# Patient Record
Sex: Male | Born: 1995 | Race: Black or African American | Hispanic: No | Marital: Single | Smoking: Never smoker
Health system: Southern US, Community
[De-identification: ages and names within clinical notes are randomized; demographics above are authoritative.]

## PROBLEM LIST (undated history)

## (undated) DIAGNOSIS — T7840XA Allergy, unspecified, initial encounter: Secondary | ICD-10-CM

## (undated) HISTORY — PX: OTHER SURGICAL HISTORY: SHX169

## (undated) HISTORY — DX: Allergy, unspecified, initial encounter: T78.40XA

---
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
## 2017-05-14 ENCOUNTER — Ambulatory Visit: Payer: Self-pay | Admitting: Family Medicine

## 2017-05-23 ENCOUNTER — Other Ambulatory Visit: Payer: Self-pay

## 2017-05-23 ENCOUNTER — Encounter: Payer: Self-pay | Admitting: Family Medicine

## 2017-05-23 ENCOUNTER — Ambulatory Visit (INDEPENDENT_AMBULATORY_CARE_PROVIDER_SITE_OTHER): Payer: Self-pay | Admitting: Family Medicine

## 2017-05-23 VITALS — BP 120/62 | HR 60 | Temp 98.4°F | Resp 14 | Ht 68.0 in | Wt 194.0 lb

## 2017-05-23 DIAGNOSIS — K2970 Gastritis, unspecified, without bleeding: Secondary | ICD-10-CM

## 2017-05-23 DIAGNOSIS — J302 Other seasonal allergic rhinitis: Secondary | ICD-10-CM

## 2017-05-23 DIAGNOSIS — K5900 Constipation, unspecified: Secondary | ICD-10-CM

## 2017-05-23 DIAGNOSIS — F4321 Adjustment disorder with depressed mood: Secondary | ICD-10-CM

## 2017-05-23 NOTE — Patient Instructions (Signed)
F/U Physical in the fall Get zyrtec or claritin over the counter Try the miralax 1 packet in water or apple juice dexilant for 2 weeks for gastritis

## 2017-05-23 NOTE — Progress Notes (Signed)
Subjective:    Patient ID: David Marquez, male    DOB: 10/24/1995, 22 y.o.   MRN: [id number]  Patient presents for Leg Pain (L leg pain during basic training- would like recheck); Allergies (increased seasonal allergies); and Gastric Ulcer (reports that he had ulcer in basic training, but has not had any issues since he came home)   While in the [employer] took his first PT test- had sharp pains up and down lower left leg , would have pain on and off with activites/drills  Seen by medics, given "pain killers"  Ibuprofen put out of lower body PT for about 6 weeks or so   Able to run now    Was on ibuprofen for a few months, started having stomach pains/ told Still constipated, no blood in the stool   still gets some discomfort in stomach with eating  No difficulty urinating    Allergies- severe alergies, sneezing/runny nose/itching, congestion phlegm  since Feb Out of flonase Also took mucinex D    Admits to feeling depressed has been so for > 6 months 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 States stress from all over, family, school, day to day. Cant pinpoint one thing. He is not in a relationship states "he doesn't have time". Likes to keep to himself, does not talk to his parents or family about how he is feeing most of the time    Review Of Systems:  GEN- denies fatigue, fever, weight loss,weakness, recent illness HEENT- denies eye drainage, change in vision, +nasal discharge, CVS- denies chest pain, palpitations RESP- denies SOB, cough, wheeze ABD- denies N/V, change in stools, +abd pain GU- denies dysuria, hematuria, dribbling, incontinence MSK- denies joint pain, muscle aches, injury Neuro- denies headache, dizziness, syncope, seizure activity       Objective:    BP 120/62   Pulse 60   Temp 98.4 F (36.9 C) (Oral)   Resp 14   Ht  (1.727 m)   Wt 194 lb (88 kg)   SpO2 97%   BMI 29.50 kg/m  GEN- NAD, alert and oriented x3 HEENT- PERRL, EOMI, non  injected sclera, pink conjunctiva, MMM, oropharynx clear, nares clear rhinorrhea, TM clear bilat no effusion Neck- Supple, no LAD CVS- RRR, no murmur RESP-CTAB ABD-NABS,soft,NT,ND Psych- normal affect and mood MSK- FROM upper , lower ext, strength equal bilat EXT- No edema Pulses- Radial  2+        Assessment & Plan:     Possible previous tear in calf muscle with the PT, now resolved Problem List Items Addressed This Visit      Unprioritized   Seasonal allergies    otc flonase, oral antihistamine       Other Visit Diagnoses    Gastritis without bleeding, unspecified chronicity, unspecified gastritis type    -  Primary   Based on history had definite gastritis, still gives some discomfort, given samples of dexilant, insurance is not active   Constipation, unspecified constipation type       samples of miralax, increase fiber and water    Situational depression       discussed therapy. 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 See if they have therapist or if he can proceed with civilian therapist F/U via phone in 1 week, no SI      Note: This dictation was prepared with Dragon dictation along with smaller phrase technology. Any transcriptional errors that result from this  process are unintentional.

## 2017-05-23 NOTE — Assessment & Plan Note (Signed)
otc flonase, oral antihistamine

## 2017-05-30 ENCOUNTER — Telehealth: Payer: Self-pay | Admitting: *Deleted

## 2017-05-30 NOTE — Telephone Encounter (Signed)
Call placed to patient. LMTRC.  

## 2017-05-30 NOTE — Telephone Encounter (Signed)
-----   Message from Salley Scarlet, MD sent at 05/30/2017  2:29 PM EDT ----- Regarding: FW: F/U therapy Call pt see, if he looked into therapy with military or if he wants [place] to set it up. Also is insurance active   ----- Message ----- From: Salley Scarlet, MD Sent: 05/30/2017 To: Salley Scarlet, MD Subject: F/U therapy

## 2017-06-01 NOTE — Telephone Encounter (Signed)
Call placed to patient. LMTRC.  

## 2017-06-07 NOTE — Telephone Encounter (Signed)
Multiple calls placed to patient with no answer and no return call.   Message to be closed.  

## 2017-09-10 ENCOUNTER — Encounter: Payer: Self-pay | Admitting: Physician Assistant

## 2017-09-11 ENCOUNTER — Encounter: Payer: Self-pay | Admitting: Family Medicine

## 2017-10-03 ENCOUNTER — Encounter: Payer: Medicaid Other | Admitting: Physician Assistant

## 2017-10-08 ENCOUNTER — Ambulatory Visit (INDEPENDENT_AMBULATORY_CARE_PROVIDER_SITE_OTHER): Admitting: Physician Assistant

## 2017-10-08 ENCOUNTER — Encounter: Payer: Self-pay | Admitting: Physician Assistant

## 2017-10-08 VITALS — BP 138/80 | HR 63 | Temp 97.6°F | Resp 16 | Ht 69.0 in | Wt 204.2 lb

## 2017-10-08 DIAGNOSIS — J029 Acute pharyngitis, unspecified: Secondary | ICD-10-CM

## 2017-10-08 DIAGNOSIS — M7662 Achilles tendinitis, left leg: Secondary | ICD-10-CM | POA: Diagnosis not present

## 2017-10-08 MED ORDER — AMOXICILLIN 875 MG PO TABS
875.0000 mg | ORAL_TABLET | Freq: Two times a day (BID) | ORAL | 0 refills | Status: DC
Start: 2017-10-08 — End: 2018-08-14

## 2017-10-08 NOTE — Progress Notes (Signed)
Patient ID: David Marquez MRN: [id number], DOB: 15-Nov-1995, 22 y.o. Date of Encounter: @DATE @  Chief Complaint:  Chief Complaint  Patient presents with  . Sore Throat    symptoms for 1 week   . jaw tightness    left jaw discomfort when chewing   . pain in back of left leg  . painful when having a bowel movement    HPI: 22 y.o. year old male  presents with above.   He reports that he has had a sore throat for almost 2 weeks. States that the beginning he had some phlegm and drainage there but now it is all just sore throat.  Points to area of lymph nodes and says that he has felt some discomfort in those areas when he is chewing.   He also reports that he is in the [employer] and is supposed to do a PT test in October.  Therefore has been doing some running.  Points to the back of his left heel at the left Achilles region and states that he has been feeling pain there when he is doing his running.  Also states that over the past month he has noticed that sometimes when he has a bowel movement he feels some rectal pain.  States that his stool was hard and he was having to strain and that is when he first started noticing that discomfort there.  States that he does not feel any discomfort at any other time only when he is having BM. States he feels no itching or burning or hemorrhoids.  No other concerns to address today.   Past Medical History:  Diagnosis Date  . Allergy    seasonal     Home Meds: Outpatient Medications Prior to Visit  Medication Sig Dispense Refill  . fluticasone (FLONASE) 50 MCG/ACT nasal spray Place 2 sprays into both nostrils daily. 16 g 6   No facility-administered medications prior to visit.     Allergies: No Known Allergies  Social History   Socioeconomic History  . Marital status: Single    Spouse name: Not on file  . Number of children: Not on file  . Years of education: Not on file  . Highest education level: Not on file  Occupational  History  . Not on file  Social Needs  . Financial resource strain: Not on file  . Food insecurity:    Worry: Not on file    Inability: Not on file  . Transportation needs:    Medical: Not on file    Non-medical: Not on file  Tobacco Use  . Smoking status: Never Smoker  . Smokeless tobacco: Never Used  Substance and Sexual Activity  . Alcohol use: No  . Drug use: No  . Sexual activity: Not on file  Lifestyle  . Physical activity:    Days per week: Not on file    Minutes per session: Not on file  . Stress: Not on file  Relationships  . Social connections:    Talks on phone: Not on file    Gets together: Not on file    Attends religious service: Not on file    Active member of club or organization: Not on file    Attends meetings of clubs or organizations: Not on file    Relationship status: Not on file  . Intimate partner violence:    Fear of current or ex partner: Not on file    Emotionally abused: Not on file  Physically abused: Not on file    Forced sexual activity: Not on file  Other Topics Concern  . Not on file  Social History Narrative  . Not on file    Family History  Problem Relation Age of Onset  . Healthy Mother   . Healthy Father   . Diabetes Father        borderline  . Myopathy Father        unknown cause  . Asthma Sister   . Healthy Brother   . Hypertension Maternal Grandmother   . Healthy Maternal Grandfather   . Diabetes Paternal Grandmother   . Hypertension Paternal Grandmother   . Healthy Paternal Grandfather      Review of Systems:  See HPI for pertinent ROS. All other ROS negative.    Physical Exam: Blood pressure 138/80, pulse 63, temperature 97.6 F (36.4 C), temperature source Oral, resp. rate 16, height 5\' 9"  (1.753 m), weight 92.6 kg, SpO2 97 %., Body mass index is 30.16 kg/m. General: WNWD AAM. Appears in no acute distress. Head: Normocephalic, atraumatic, eyes without discharge, sclera non-icteric, nares are without  discharge. Bilateral auditory canals clear, TM's are without perforation, pearly grey and translucent with reflective cone of light bilaterally.  Posterior pharynx is with moderate erythema.  There is a small ulcerative region on her right side of his pharynx. Neck: Supple. No thyromegaly.  He does have enlarged bilateral tonsillar and cervical lymph nodes.  These are enlarged and tender with palpation. Lungs: Clear bilaterally to auscultation without wheezes, rales, or rhonchi. Breathing is unlabored. Heart: RRR with S1 S2. No murmurs, rubs, or gallops. Abdomen: Soft, non-tender, non-distended with normoactive bowel sounds. No hepatomegaly. No rebound/guarding. No obvious abdominal masses. Musculoskeletal:  Strength and tone normal for age.  Has tenderness with palpation at the posterior aspect of his left heel.  Also had him do a stretch that stretches the Achilles tendon and this does reproduce his symptoms that he has been having. Extremities/Skin: Warm and dry.  Neuro: Alert and oriented X 3. Moves all extremities spontaneously. Gait is normal. CNII-XII grossly in tact. Psych:  Responds to questions appropriately with a normal affect.     ASSESSMENT AND PLAN:  22 y.o. year old male with  1. Acute pharyngitis, unspecified etiology Rapid strep test is negative.  However he has had sore throat for almost 2 weeks and his exam does show significant findings.  He is to take the amoxicillin as directed.  Follow-up if symptoms do not resolve upon completion of antibiotic.  He can use lozenges or spray or Tylenol as needed for symptom relief in the interim. - amoxicillin (AMOXIL) 875 MG tablet; Take 1 tablet (875 mg total) by mouth 2 (two) times daily.  Dispense: 20 tablet; Refill: 0  2. Achilles tendinitis of left lower extremity He is to rest the area.  Stop the running until symptoms resolve.  Is to do this stretch routinely and then once he does start back to running is to do this stretch that I  have demonstrated.  To apply ice to the area.  Given.  He states that if he has a documentation that the PT test can be put off to the following month.  That note has been given.  3. Sore throat - STREP GROUP A AG, W/REFLEX TO CULT  Constipation:  He is to use Miralax.  Start with just a half of a capful daily and then adjust dose.  Follow-up if the symptoms do not resolve.  Murray Hodgkins [place], [place], [facility] 10/08/2017 4:07 PM

## 2017-10-10 LAB — CULTURE, GROUP A STREP
MICRO NUMBER:: 91139489
SOURCE:: 0
SPECIMEN QUALITY: ADEQUATE

## 2017-10-10 LAB — STREP GROUP A AG, W/REFLEX TO CULT: Streptococcus, Group A Screen (Direct): NOT DETECTED

## 2018-02-25 ENCOUNTER — Other Ambulatory Visit: Payer: Self-pay | Admitting: Family Medicine

## 2018-02-25 MED ORDER — OSELTAMIVIR PHOSPHATE 75 MG PO CAPS: 75.0000 mg | ORAL_CAPSULE | Freq: Every day | ORAL | 0 refills | Status: DC

## 2018-02-25 NOTE — Progress Notes (Signed)
Pt father in office  with Flu symptoms  given tamiflu, he has not been vaccinated Mother in office

## 2018-07-01 ENCOUNTER — Emergency Department (HOSPITAL_COMMUNITY)
Admission: EM | Admit: 2018-07-01 | Discharge: 2018-07-01 | Disposition: A | Discharge status: Home / Self Care | Payer: Self-pay | Attending: Emergency Medicine | Admitting: Emergency Medicine

## 2018-07-01 ENCOUNTER — Encounter (HOSPITAL_COMMUNITY): Payer: Self-pay | Admitting: Student

## 2018-07-01 DIAGNOSIS — J020 Streptococcal pharyngitis: Secondary | ICD-10-CM | POA: Insufficient documentation

## 2018-07-01 DIAGNOSIS — Z79899 Other long term (current) drug therapy: Secondary | ICD-10-CM | POA: Insufficient documentation

## 2018-07-01 LAB — GROUP A STREP BY PCR: Group A Strep by PCR: DETECTED — AB

## 2018-07-01 MED ORDER — ONDANSETRON 4 MG PO TBDP: 4.0000 mg | ORAL_TABLET | Freq: Three times a day (TID) | ORAL | 0 refills | Status: DC | PRN

## 2018-07-01 MED ORDER — ACETAMINOPHEN 325 MG PO TABS
650.0000 mg | ORAL_TABLET | Freq: Once | ORAL | Status: AC
Start: 2018-07-01 — End: 2018-07-01
  Administered 2018-07-01: 650 mg via ORAL
  Filled 2018-07-01: qty 2

## 2018-07-01 MED ORDER — ONDANSETRON 4 MG PO TBDP
4.0000 mg | ORAL_TABLET | Freq: Once | ORAL | Status: AC
  Administered 2018-07-01: 4 mg via ORAL
  Filled 2018-07-01: qty 1

## 2018-07-01 MED ORDER — IBUPROFEN 100 MG/5ML PO SUSP
ORAL | Status: AC
  Administered 2018-07-01: 11:00:00 600 mg
  Filled 2018-07-01: qty 30

## 2018-07-01 MED ORDER — DEXAMETHASONE SODIUM PHOSPHATE 10 MG/ML IJ SOLN
10.0000 mg | Freq: Once | INTRAMUSCULAR | Status: AC
  Administered 2018-07-01: 10:00:00 10 mg via INTRAMUSCULAR
  Filled 2018-07-01: qty 1

## 2018-07-01 MED ORDER — NAPROXEN 500 MG PO TABS: 500.0000 mg | ORAL_TABLET | Freq: Two times a day (BID) | ORAL | 0 refills | Status: DC

## 2018-07-01 MED ORDER — LIDOCAINE VISCOUS HCL 2 % MT SOLN
15.0000 mL | Freq: Once | OROMUCOSAL | Status: AC
  Administered 2018-07-01: 10:00:00 15 mL via OROMUCOSAL
  Filled 2018-07-01: qty 15

## 2018-07-01 MED ORDER — PENICILLIN G BENZATHINE 1200000 UNIT/2ML IM SUSP
1.2000 10*6.[IU] | Freq: Once | INTRAMUSCULAR | Status: AC
  Administered 2018-07-01: 11:00:00 1.2 10*6.[IU] via INTRAMUSCULAR
  Filled 2018-07-01: qty 2

## 2018-07-01 NOTE — ED Triage Notes (Signed)
Per mother, pt in with sore throat, hoarseness and fever x 3 days. States temp was 102 PTA. C/o pain when swallowing and feels tonsils are swollen. HA and n/v since yesterday as well

## 2018-07-01 NOTE — ED Provider Notes (Signed)
[facility] Provider Note   CSN: [id number] Arrival date & time: 07/01/18  0849     History   Chief Complaint Chief Complaint  Patient presents with  . Fever  . Sore Throat  . Emesis    HPI David DredgeJeremy R Gupta is a 23 y.o. male who presents to the emergency department with a complaint of sore throat for the past 2 to 3 days.  Patient states that his sore throat has been constant, progressively worsening, and is currently a 10 out of 10 in severity.  Throat pain is associated with sensation of tonsillar swelling, hoarse/scratchy voice, fever with temp max of 102, nausea, one episode of emesis, a few episodes of nonbloody diarrhea, and generalized body aches.  He states his discomfort is worse with attempts to talk and to swallow, however he is able to swallow.  He has tried taking Tylenol without much relief.  Has not had any antiemetics today.  Denies congestion, ear pain, cough, dyspnea, abdominal pain, chest pain, melena, hematochezia, or hematemesis.  He does have sick contacts with a 21-year-old that is sick with similar symptoms and is currently being evaluated in the pediatric emergency department.  He has no known exposures to COVID-19 that he is aware of.     HPI  Past Medical History:  Diagnosis Date  . Allergy    seasonal    Patient Active Problem List   Diagnosis Date Noted  . Allergic rhinitis 05/20/2013  . Heartburn 07/11/2012  . Overweight 07/11/2012  . Seasonal allergies 07/11/2012    Past Surgical History:  Procedure Laterality Date  . none          Home Medications    Prior to Admission medications   Medication Sig Start Date End Date Taking? Authorizing Provider  amoxicillin (AMOXIL) 875 MG tablet Take 1 tablet (875 mg total) by mouth 2 (two) times daily. 10/08/17   Allayne Butcherixon, Mary B, PA-C  fluticasone (FLONASE) 50 MCG/ACT nasal spray Place 2 sprays into both nostrils daily. 10/14/14   Salley Scarleturham, Kawanta F, MD  oseltamivir  (TAMIFLU) 75 MG capsule Take 1 capsule (75 mg total) by mouth daily. 02/25/18   Salley Scarleturham, Kawanta F, MD    Family History Family History  Problem Relation Age of Onset  . Healthy Mother   . Healthy Father   . Diabetes Father        borderline  . Myopathy Father        unknown cause  . Asthma Sister   . Healthy Brother   . Hypertension Maternal Grandmother   . Healthy Maternal Grandfather   . Diabetes Paternal Grandmother   . Hypertension Paternal Grandmother   . Healthy Paternal Grandfather     Social History Social History   Tobacco Use  . Smoking status: Never Smoker  . Smokeless tobacco: Never Used  Substance Use Topics  . Alcohol use: No  . Drug use: No     Allergies   Patient has no known allergies.   Review of Systems Review of Systems  Constitutional: Positive for chills and fever.  HENT: Positive for sore throat, trouble swallowing and voice change. Negative for congestion and ear pain.   Respiratory: Negative for cough and shortness of breath.   Cardiovascular: Negative for chest pain.  Gastrointestinal: Positive for diarrhea, nausea and vomiting (x1). Negative for abdominal pain.  Genitourinary: Negative for dysuria.  Musculoskeletal: Positive for myalgias.  Neurological: Negative for syncope.  All other systems reviewed and are  negative.    Physical Exam Updated Vital Signs BP (!) 140/99 (BP Location: Right Arm)   Pulse 91   Temp (!) 101.1 F (38.4 C) (Oral)   Resp 16   SpO2 99%   Physical Exam Vitals signs and nursing note reviewed.  Constitutional:      General: He is not in acute distress.    Appearance: He is well-developed. He is not toxic-appearing.  HENT:     Head: Normocephalic and atraumatic.     Right Ear: Ear canal normal. Tympanic membrane is not perforated, erythematous or retracted.     Left Ear: Ear canal normal. Tympanic membrane is not perforated, erythematous or retracted.     Ears:     Comments: No mastoid erythema,  tenderness, or swelling.    Nose: Congestion present.     Right Sinus: No maxillary sinus tenderness or frontal sinus tenderness.     Left Sinus: No maxillary sinus tenderness or frontal sinus tenderness.     Mouth/Throat:     Mouth: Mucous membranes are moist.     Pharynx: Uvula midline. Posterior oropharyngeal erythema present.     Tonsils: Tonsillar exudate present. 2+ on the right. 2+ on the left.     Comments: Posterior oropharynx is symmetric appearing. Patient tolerating own secretions without difficulty. No trismus. No drooling. No hot potato voice. Voice is somewhat raspy. No swelling beneath the tongue, submandibular compartment is soft.   Eyes:     General:        Right eye: No discharge.        Left eye: No discharge.     Conjunctiva/sclera: Conjunctivae normal.  Neck:     Musculoskeletal: Normal range of motion and neck supple. No neck rigidity.  Cardiovascular:     Rate and Rhythm: Normal rate and regular rhythm.  Pulmonary:     Effort: Pulmonary effort is normal. No respiratory distress.     Breath sounds: Normal breath sounds. No wheezing, rhonchi or rales.  Abdominal:     General: There is no distension.     Palpations: Abdomen is soft.     Tenderness: There is no abdominal tenderness. There is no guarding or rebound.  Lymphadenopathy:     Cervical: Cervical adenopathy (mild anterior) present.  Skin:    General: Skin is warm and dry.     Findings: No rash.  Neurological:     General: No focal deficit present.     Mental Status: He is alert.     Comments: Clear speech.   Psychiatric:        Behavior: Behavior normal.    ED Treatments / Results  Labs (all labs ordered are listed, but only abnormal results are displayed) Labs Reviewed  GROUP A STREP BY PCR - Abnormal; Notable for the following components:      Result Value   Group A Strep by PCR DETECTED (*)    All other components within normal limits    EKG    Radiology No results found.   Procedures Procedures (including critical care time)  Medications Ordered in ED Medications  ondansetron (ZOFRAN-ODT) disintegrating tablet 4 mg (4 mg Oral Given 07/01/18 0938)  acetaminophen (TYLENOL) tablet 650 mg (650 mg Oral Given 07/01/18 0938)  lidocaine (XYLOCAINE) 2 % viscous mouth solution 15 mL (15 mLs Mouth/Throat Given 07/01/18 0938)  dexamethasone (DECADRON) injection 10 mg (10 mg Intramuscular Given 07/01/18 [id number])     Initial Impression / Assessment and Plan / ED Course  I have  reviewed the triage vital signs and the nursing notes.  Pertinent labs & imaging results that were available during my care of the patient were reviewed by me and considered in my medical decision making (see chart for details).   Patient is a 23 year old male who presents to the emergency department with chief complaint of sore throat as well as associated fever, body aches, 1 episode of emesis, & diarrhea.  He is nontoxic-appearing, in no apparent distress, his vitals are notable for a fever and somewhat elevated blood pressure, doubt HTN emergency.  He does not appear to be septic.  He does have posterior oropharyngeal/tonsillar erythema with exudates & 2+ symmetric tonsillar edema. Exam does not appear consistent w/ RPA/PTA. Tolerating own secretions, no drooling, no hot potato voice. Lungs CTA. Abdomen nontender without peritoneal signs- do not suspect acute surgical abdominal process. Trial of viscous lidocaine, zofran, & Decadron in the ED for symptomatic control, tylenol for fever. Strep swab. Re-assess.   Strep positive- likely etiology to his sxs- IM Bicillin administered in the ED for this.  Feeling somewhat better following interventions here, tolerating PO  Will discharge home with zofran & naproxen. PCP follow up. . I discussed results, treatment plan, need for follow-up, and return precautions with the patient. Provided opportunity for questions, patient confirmed understanding and is in  agreement with plan.   Vitals:   07/01/18 0859 07/01/18 1121  BP: (!) 140/99 125/84  Pulse: 91 80  Resp: 16   Temp: (!) 101.1 F (38.4 C) 100.3 F (37.9 C)  SpO2: 99% 100%    Final Clinical Impressions(s) / ED Diagnoses   Final diagnoses:  Strep pharyngitis    ED Discharge Orders         Ordered    ondansetron (ZOFRAN ODT) 4 MG disintegrating tablet  Every 8 hours PRN     07/01/18 1059    naproxen (NAPROSYN) 500 MG tablet  2 times daily     07/01/18 [address], [place] R, PA-C 07/01/18 [place], [place], DO 07/01/18 1514

## 2018-07-01 NOTE — Discharge Instructions (Addendum)
You were seen in the emergency department and diagnosed with strep throat.  You were given a shot of Penicillin and a shot of Decadron.  - Penicillin is an antibiotic used to treat the infection - Decadron is a steroid used to treat the pain and swelling of your throat.   We are sending you home with the following medicines:  - Naproxen is a nonsteroidal anti-inflammatory medication that will help with pain and swelling. Be sure to take this medication as prescribed with food, 1 pill every 12 hours,  It should be taken with food, as it can cause stomach upset, and more seriously, stomach bleeding. Do not take other nonsteroidal anti-inflammatory medications with this such as Advil, Motrin, Aleve, Mobic, Goodie Powder, or Motrin.    - Zofran- this is an anti-nausea medicines to be taken every 8 hours as needed.  You make take Tylenol per over the counter dosing with these medications for further fever/pain.   We have prescribed you new medication(s) today. Discuss the medications prescribed today with your pharmacist as they can have adverse effects and interactions with your other medicines including over the counter and prescribed medications. Seek medical evaluation if you start to experience new or abnormal symptoms after taking one of these medicines, seek care immediately if you start to experience difficulty breathing, feeling of your throat closing, facial swelling, or rash as these could be indications of a more serious allergic reaction  You should gradually feel better over the next few days. Follow up with your primary care provider in the next 3 days if you are not feeling better, if you do not have a primary care provider one is provided in your discharge instructions. Return to the emergency department for any new or worsening symptoms including but not limited to inability to open your mouth, inability to move your neck, worsening pain, change in your voice, inability to swallow your own  saliva, drooling, or any other concerns.

## 2018-08-14 ENCOUNTER — Ambulatory Visit (INDEPENDENT_AMBULATORY_CARE_PROVIDER_SITE_OTHER): Admitting: Family Medicine

## 2018-08-14 ENCOUNTER — Encounter: Payer: Self-pay | Admitting: Family Medicine

## 2018-08-14 ENCOUNTER — Other Ambulatory Visit: Payer: Self-pay

## 2018-08-14 DIAGNOSIS — J301 Allergic rhinitis due to pollen: Secondary | ICD-10-CM

## 2018-08-14 DIAGNOSIS — M791 Myalgia, unspecified site: Secondary | ICD-10-CM

## 2018-08-14 DIAGNOSIS — R51 Headache: Secondary | ICD-10-CM

## 2018-08-14 DIAGNOSIS — R519 Headache, unspecified: Secondary | ICD-10-CM

## 2018-08-14 DIAGNOSIS — G8929 Other chronic pain: Secondary | ICD-10-CM

## 2018-08-14 MED ORDER — SUMATRIPTAN SUCCINATE 100 MG PO TABS: 100.0000 mg | ORAL_TABLET | ORAL | 1 refills | Status: AC | PRN

## 2018-08-14 MED ORDER — CETIRIZINE HCL 10 MG PO TABS: 10.0000 mg | ORAL_TABLET | Freq: Every day | ORAL | 11 refills | Status: AC

## 2018-08-14 MED ORDER — FLUTICASONE PROPIONATE 50 MCG/ACT NA SUSP: 2.0000 | Freq: Every day | NASAL | 6 refills | Status: AC

## 2018-08-14 MED ORDER — FLUTICASONE PROPIONATE 50 MCG/ACT NA SUSP: 2.0000 | Freq: Every day | NASAL | 6 refills | Status: DC

## 2018-08-14 MED ORDER — CETIRIZINE HCL 10 MG PO TABS: 10.0000 mg | ORAL_TABLET | Freq: Every day | ORAL | 11 refills | Status: DC

## 2018-08-14 MED ORDER — SUMATRIPTAN SUCCINATE 100 MG PO TABS: 100.0000 mg | ORAL_TABLET | ORAL | 1 refills | Status: DC | PRN

## 2018-08-14 NOTE — Progress Notes (Signed)
Virtual Visit via Telephone Note  I connected with David Marquez on 08/14/18 at 2:08pm by telephone and verified that I am speaking with the correct person using two identifiers.   I discussed the limitations, risks, security and privacy concerns of performing an evaluation and management service by telephone and the availability of in person appointments. I also discussed with the patient that there may be a patient responsible charge related to this service. The patient expressed understanding and agreed to proceed.   History of Present Illness:  He has had headache on and off for the past few months.  He has been taking ibuprofen.  He denies any change in his vision associated.  He does have headaches in his family.  He has not checked his blood pressure during any of these episodes.   Since last Saturday however he started having increased headache and some muscle aches.  His brothers also have respiratory symptoms and his mother was swab for [place] a week or so ago that did come back negative.  He went to work on Monday. During work started feeling tightness in chest and was sent home. No fever. Feels fatigued , no fever.  Denies SOB, and wheezing.  No fever, no GI symptoms  He has been taking ibuprofen which helps a little If he is relaxed does not feel any tightness in chest , but if he gets worked up that he will feel it. He has had some allergies- sneezing/zyrtec/flonase he ran out of  Works at [employer] and [employer]     Observations/Objective: NAD noted on phone   Assessment and Plan:     COVID TESTING, MIGRAINES, ALLERGIES, MYALGIAS Potential COVID exposure he works at a [employer] and his brother and mother have had respiratory symptoms.  We will send him for COVID-19  testing his work also requires this before he returns. Does have myalgias along with the headaches other some of this is likely allergy and migraine related.  And then restart his allergy medication Zyrtec and  Flonase.  We will also send over Imitrex for the migraine headaches that he has been having for the past few months. At this time he does not sound to be in any type of respiratory distress.  Seems that is more anxiety causing the tightness.  We have had some issues with anxiety which we have discussed in previous visits.  Of note I also discussed his care with his father so that he can make sure that they do get tested in the morning  Will reassess based on his COVID-19  testing results.  They are aware for quarantine until they get negative test results.  Follow Up Instructions:    I discussed the assessment and treatment plan with the patient. The patient was provided an opportunity to ask questions and all were answered. The patient agreed with the plan and demonstrated an understanding of the instructions.   The patient was advised to call back or seek an in-person evaluation if the symptoms worsen or if the condition fails to improve as anticipated.  I provided12 minutes of non-face-to-face time during this encounter. End time 2:20pm  Vic Blackbird, MD

## 2018-08-15 ENCOUNTER — Other Ambulatory Visit: Payer: Self-pay

## 2018-08-15 DIAGNOSIS — Z20822 Contact with and (suspected) exposure to covid-19: Secondary | ICD-10-CM

## 2018-08-18 LAB — NOVEL CORONAVIRUS, NAA: SARS-CoV-2, NAA: NOT DETECTED

## 2018-08-20 ENCOUNTER — Encounter: Payer: Self-pay | Admitting: *Deleted

## 2018-10-31 DIAGNOSIS — Z20828 Contact with and (suspected) exposure to other viral communicable diseases: Secondary | ICD-10-CM | POA: Diagnosis not present

## 2019-02-06 DIAGNOSIS — Z20828 Contact with and (suspected) exposure to other viral communicable diseases: Secondary | ICD-10-CM | POA: Diagnosis not present

## 2019-04-03 ENCOUNTER — Telehealth: Payer: Self-pay | Admitting: Family Medicine

## 2019-04-03 NOTE — Telephone Encounter (Signed)
Patient left voicemail saying that this morning he woke up sick, and weak, now he is fine would like a call back to maybe get an explanation why he would have felt this way  [phone]

## 2019-04-04 NOTE — Telephone Encounter (Signed)
Call placed to patient. LMTRC.  

## 2019-04-07 NOTE — Telephone Encounter (Signed)
Multiple calls placed to patient with no answer and no return call.   Message to be closed.  

## 2019-04-30 ENCOUNTER — Ambulatory Visit (INDEPENDENT_AMBULATORY_CARE_PROVIDER_SITE_OTHER): Payer: BC Managed Care – PPO | Admitting: Family Medicine

## 2019-04-30 ENCOUNTER — Other Ambulatory Visit: Payer: Self-pay

## 2019-04-30 ENCOUNTER — Encounter: Payer: Self-pay | Admitting: Family Medicine

## 2019-04-30 VITALS — BP 138/84 | HR 64 | Temp 97.8°F | Resp 12 | Ht 69.0 in | Wt 236.0 lb

## 2019-04-30 DIAGNOSIS — R0602 Shortness of breath: Secondary | ICD-10-CM

## 2019-04-30 DIAGNOSIS — F419 Anxiety disorder, unspecified: Secondary | ICD-10-CM | POA: Diagnosis not present

## 2019-04-30 DIAGNOSIS — G4700 Insomnia, unspecified: Secondary | ICD-10-CM | POA: Diagnosis not present

## 2019-04-30 DIAGNOSIS — R030 Elevated blood-pressure reading, without diagnosis of hypertension: Secondary | ICD-10-CM | POA: Diagnosis not present

## 2019-04-30 NOTE — Assessment & Plan Note (Signed)
I query if his shortness of breath episode was anxiety attack. 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 He has significant stressors within his life which he has had in the past. 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 He is willing to try psychotherapy again. For his sleep we discussed trying melatonin to help him wind down he will start this first. We will hold off on any prescribed anxiety medications at this time. He did have elevated blood pressure but it did improve some with just him sitting. There is a family history of high blood pressure. There is also underlying family history of anxiety and mood disorders. We will have him check his blood pressure at home. I did check some labs today as well. I think that he can return to work note was given

## 2019-04-30 NOTE — Progress Notes (Signed)
Subjective:    Patient ID: David Marquez, male    DOB: 12/24/1995, 24 y.o.   MRN: [id number]  Patient presents for Anxiety (4/12- approx 1 am- episode at work with SOB, chest pressure- EMS checked out- refued to go to ER- increased stress )   He was at work and felt SOB, He sat down for a bit but it didn't improve He kept working and had increased pressure in his chest , felt like something was sitting on his chest.so EMS was called   He is currently fasting so he didn't think it was indigestion  Told it could have been anxiety attack. He has not had any other spells. The last episode eased up after about an hour and a half once he got home. He does admit there has been significant stressors. He is living at home preparing to go back to school in the fall. He is still in the [employer]. He is very some stressors in the household in general. He is not going to particulars. States that he has dealt with anxiety on and off for quite some time.  Has difficulty sleeping due to 3rd shift, sleeps 4-5 hours a day still cant sleep even on his off days   He works 8 hours 6 days a week   He has not had any other spells next Monday.  He has not been ill, has not had any cough congestion fever ,no travel ,no leg swelling  Review Of Systems:  GEN- denies fatigue, fever, weight loss,weakness, recent illness HEENT- denies eye drainage, change in vision, nasal discharge, CVS- denies chest pain, palpitations RESP- denies SOB, cough, wheeze ABD- denies N/V, change in stools, abd pain GU- denies dysuria, hematuria, dribbling, incontinence MSK- denies joint pain, muscle aches, injury Neuro- denies headache, dizziness, syncope, seizure activity       Objective:    BP 138/84   Pulse 64   Temp 97.8 F (36.6 C) (Temporal)   Resp 12   Ht 5\' 9"  (1.753 m)   Wt 236 lb (107 kg)   SpO2 97%   BMI 34.85 kg/m  GEN- NAD, alert and oriented x3 HEENT- PERRL, EOMI, non injected sclera, pink conjunctiva, MMM,  oropharynx clear Neck- Supple, no thyromegaly CVS- RRR, no murmur RESP-CTAB ABD-NABS,soft,NT,ND Psych fatigued appearing but normal affect and mood very polite not overly anxious EXT- No edema Pulses- Radial2+  EKG bradycardia no ST elevation or depression      Assessment & Plan:      Problem List Items Addressed This Visit      Unprioritized   Anxiety    I query if his shortness of breath episode was anxiety attack. 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 He has significant stressors within his life which he has had in the past. He did have some counseling when he was in the [employer] during basic training but he states it was not very beneficial. He is willing to try psychotherapy again. For his sleep we discussed trying melatonin to help him wind down he will start this first. We will hold off on any prescribed anxiety medications at this time. He did have elevated blood pressure but it did improve some with just him sitting. There is a family history of high blood pressure. There is also underlying family history of anxiety and mood disorders. We will have him check his blood pressure at home. I did check some  labs today as well. I think that he can return to work note was given      Relevant Orders   Ambulatory referral to Psychology   Insomnia   Relevant Orders   Ambulatory referral to Psychology    Other Visit Diagnoses    SOB (shortness of breath)    -  Primary   Relevant Orders   EKG 12-Lead (Completed)   CBC with Differential/Platelet   Comprehensive metabolic panel   Elevated blood pressure reading       Relevant Orders   CBC with Differential/Platelet   Comprehensive metabolic panel   TSH      Note: This dictation was prepared with Dragon dictation along with smaller phrase technology. Any transcriptional errors that result from this process are unintentional.

## 2019-04-30 NOTE — Patient Instructions (Addendum)
We will call with lab results Referral to therapist Try melatonin 3 to 5mg , 1 hour before sleep  Check your blood blood pressure Call if > 140/90 F/U 6 weeks for Physical

## 2019-05-01 LAB — CBC WITH DIFFERENTIAL/PLATELET
Absolute Monocytes: 370 cells/uL (ref 200–950)
Basophils Absolute: 29 cells/uL (ref 0–200)
Basophils Relative: 0.7 %
Eosinophils Absolute: 109 cells/uL (ref 15–500)
Eosinophils Relative: 2.6 %
HCT: 45.7 % (ref 38.5–50.0)
Hemoglobin: 15.4 g/dL (ref 13.2–17.1)
Lymphs Abs: 2407 cells/uL (ref 850–3900)
MCH: 28.8 pg (ref 27.0–33.0)
MCHC: 33.7 g/dL (ref 32.0–36.0)
MCV: 85.6 fL (ref 80.0–100.0)
MPV: 11.1 fL (ref 7.5–12.5)
Monocytes Relative: 8.8 %
Neutro Abs: 1285 cells/uL — ABNORMAL LOW (ref 1500–7800)
Neutrophils Relative %: 30.6 %
Platelets: 192 10*3/uL (ref 140–400)
RBC: 5.34 10*6/uL (ref 4.20–5.80)
RDW: 12.6 % (ref 11.0–15.0)
Total Lymphocyte: 57.3 %
WBC: 4.2 10*3/uL (ref 3.8–10.8)

## 2019-05-01 LAB — COMPREHENSIVE METABOLIC PANEL
AG Ratio: 1.4 (calc) (ref 1.0–2.5)
ALT: 17 U/L (ref 9–46)
AST: 19 U/L (ref 10–40)
Albumin: 4.5 g/dL (ref 3.6–5.1)
Alkaline phosphatase (APISO): 68 U/L (ref 36–130)
BUN: 11 mg/dL (ref 7–25)
CO2: 28 mmol/L (ref 20–32)
Calcium: 9.7 mg/dL (ref 8.6–10.3)
Chloride: 102 mmol/L (ref 98–110)
Creat: 1.1 mg/dL (ref 0.60–1.35)
Globulin: 3.2 g/dL (calc) (ref 1.9–3.7)
Glucose, Bld: 99 mg/dL (ref 65–99)
Potassium: 4.7 mmol/L (ref 3.5–5.3)
Sodium: 137 mmol/L (ref 135–146)
Total Bilirubin: 0.5 mg/dL (ref 0.2–1.2)
Total Protein: 7.7 g/dL (ref 6.1–8.1)

## 2019-05-01 LAB — TSH: TSH: 1.15 mIU/L (ref 0.40–4.50)

## 2019-05-02 ENCOUNTER — Encounter: Payer: Self-pay | Admitting: *Deleted

## 2019-06-11 ENCOUNTER — Encounter: Payer: BC Managed Care – PPO | Admitting: Family Medicine

## 2019-08-05 DIAGNOSIS — Z20822 Contact with and (suspected) exposure to covid-19: Secondary | ICD-10-CM | POA: Diagnosis not present

## 2019-08-05 DIAGNOSIS — Z03818 Encounter for observation for suspected exposure to other biological agents ruled out: Secondary | ICD-10-CM | POA: Diagnosis not present

## 2019-09-01 DIAGNOSIS — Z20822 Contact with and (suspected) exposure to covid-19: Secondary | ICD-10-CM | POA: Diagnosis not present

## 2019-09-01 DIAGNOSIS — Z03818 Encounter for observation for suspected exposure to other biological agents ruled out: Secondary | ICD-10-CM | POA: Diagnosis not present

## 2019-10-03 ENCOUNTER — Ambulatory Visit (INDEPENDENT_AMBULATORY_CARE_PROVIDER_SITE_OTHER): Payer: BC Managed Care – PPO | Admitting: Family Medicine

## 2019-10-03 ENCOUNTER — Encounter: Payer: Self-pay | Admitting: Family Medicine

## 2019-10-03 ENCOUNTER — Other Ambulatory Visit: Payer: Self-pay

## 2019-10-03 VITALS — BP 146/82 | HR 82 | Temp 98.1°F | Resp 14 | Ht 69.0 in | Wt 238.0 lb

## 2019-10-03 DIAGNOSIS — E669 Obesity, unspecified: Secondary | ICD-10-CM | POA: Diagnosis not present

## 2019-10-03 DIAGNOSIS — R030 Elevated blood-pressure reading, without diagnosis of hypertension: Secondary | ICD-10-CM | POA: Diagnosis not present

## 2019-10-03 DIAGNOSIS — F419 Anxiety disorder, unspecified: Secondary | ICD-10-CM

## 2019-10-03 NOTE — Patient Instructions (Addendum)
F/u 1 month I want blood pressure to stay < 140/90 We will call with lab results  Low salt diet, reduce fast food, fried foods  Increase water and fiber rich foods(veggies, oatmeal, whole grains)  referral to therapist    DASH Eating Plan DASH stands for "Dietary Approaches to Stop Hypertension." The DASH eating plan is a healthy eating plan that has been shown to reduce high blood pressure (hypertension). It may also reduce your risk for type 2 diabetes, heart disease, and stroke. The DASH eating plan may also help with weight loss. What are tips for following this plan?  General guidelines  Avoid eating more than 2,300 mg (milligrams) of salt (sodium) a day. If you have hypertension, you may need to reduce your sodium intake to 1,500 mg a day.  Limit alcohol intake to no more than 1 drink a day for nonpregnant women and 2 drinks a day for men. One drink equals 12 oz of beer, 5 oz of wine, or 1 oz of hard liquor.  Work with your health care provider to maintain a healthy body weight or to lose weight. Ask what an ideal weight is for you.  Get at least 30 minutes of exercise that causes your heart to beat faster (aerobic exercise) most days of the week. Activities may include walking, swimming, or biking.  Work with your health care provider or diet and nutrition specialist (dietitian) to adjust your eating plan to your individual calorie needs. Reading food labels   Check food labels for the amount of sodium per serving. Choose foods with less than 5 percent of the Daily Value of sodium. Generally, foods with less than 300 mg of sodium per serving fit into this eating plan.  To find whole grains, look for the word "whole" as the first word in the ingredient list. Shopping  Buy products labeled as "low-sodium" or "no salt added."  Buy fresh foods. Avoid canned foods and premade or frozen meals. Cooking  Avoid adding salt when cooking. Use salt-free seasonings or herbs instead of  table salt or sea salt. Check with your health care provider or pharmacist before using salt substitutes.  Do not fry foods. Cook foods using healthy methods such as baking, boiling, grilling, and broiling instead.  Cook with heart-healthy oils, such as olive, canola, soybean, or sunflower oil. Meal planning  Eat a balanced diet that includes: ? 5 or more servings of fruits and vegetables each day. At each meal, try to fill half of your plate with fruits and vegetables. ? Up to 6-8 servings of whole grains each day. ? Less than 6 oz of lean meat, poultry, or fish each day. A 3-oz serving of meat is about the same size as a deck of cards. One egg equals 1 oz. ? 2 servings of low-fat dairy each day. ? A serving of nuts, seeds, or beans 5 times each week. ? Heart-healthy fats. Healthy fats called Omega-3 fatty acids are found in foods such as flaxseeds and coldwater fish, like sardines, salmon, and mackerel.  Limit how much you eat of the following: ? Canned or prepackaged foods. ? Food that is high in trans fat, such as fried foods. ? Food that is high in saturated fat, such as fatty meat. ? Sweets, desserts, sugary drinks, and other foods with added sugar. ? Full-fat dairy products.  Do not salt foods before eating.  Try to eat at least 2 vegetarian meals each week.  Eat more home-cooked food and less  restaurant, buffet, and fast food.  When eating at a restaurant, ask that your food be prepared with less salt or no salt, if possible. What foods are recommended? The items listed may not be a complete list. Talk with your dietitian about what dietary choices are best for you. Grains Whole-grain or whole-wheat bread. Whole-grain or whole-wheat pasta. Brown rice. [place]. Bulgur. Whole-grain and low-sodium cereals. Pita bread. Low-fat, low-sodium crackers. Whole-wheat flour tortillas. Vegetables Fresh or frozen vegetables (raw, steamed, roasted, or grilled). Low-sodium or  reduced-sodium tomato and vegetable juice. Low-sodium or reduced-sodium tomato sauce and tomato paste. Low-sodium or reduced-sodium canned vegetables. Fruits All fresh, dried, or frozen fruit. Canned fruit in natural juice (without added sugar). Meat and other protein foods Skinless chicken or [place]. Ground chicken or [place]. Pork with fat trimmed off. Fish and seafood. Egg whites. Dried beans, peas, or lentils. Unsalted nuts, nut butters, and seeds. Unsalted canned beans. Lean cuts of beef with fat trimmed off. Low-sodium, lean deli meat. Dairy Low-fat (1%) or fat-free (skim) milk. Fat-free, low-fat, or reduced-fat cheeses. Nonfat, low-sodium ricotta or cottage cheese. Low-fat or nonfat yogurt. Low-fat, low-sodium cheese. Fats and oils Soft margarine without trans fats. Vegetable oil. Low-fat, reduced-fat, or light mayonnaise and salad dressings (reduced-sodium). Canola, safflower, olive, soybean, and sunflower oils. Avocado. Seasoning and other foods Herbs. Spices. Seasoning mixes without salt. Unsalted popcorn and pretzels. Fat-free sweets. What foods are not recommended? The items listed may not be a complete list. Talk with your dietitian about what dietary choices are best for you. Grains Baked goods made with fat, such as croissants, muffins, or some breads. Dry pasta or rice meal packs. Vegetables Creamed or fried vegetables. Vegetables in a cheese sauce. Regular canned vegetables (not low-sodium or reduced-sodium). Regular canned tomato sauce and paste (not low-sodium or reduced-sodium). Regular tomato and vegetable juice (not low-sodium or reduced-sodium). Angie Fava. Olives. Fruits Canned fruit in a light or heavy syrup. Fried fruit. Fruit in cream or butter sauce. Meat and other protein foods Fatty cuts of meat. Ribs. Fried meat. Berniece Salines. Sausage. Bologna and other processed lunch meats. Salami. Fatback. Hotdogs. Bratwurst. Salted nuts and seeds. Canned beans with added salt. Canned or  smoked fish. Whole eggs or egg yolks. Chicken or [place] with skin. Dairy Whole or 2% milk, cream, and half-and-half. Whole or full-fat cream cheese. Whole-fat or sweetened yogurt. Full-fat cheese. Nondairy creamers. Whipped toppings. Processed cheese and cheese spreads. Fats and oils Butter. Stick margarine. Lard. Shortening. Ghee. Bacon fat. Tropical oils, such as coconut, palm kernel, or palm oil. Seasoning and other foods Salted popcorn and pretzels. Onion salt, garlic salt, seasoned salt, table salt, and sea salt. Worcestershire sauce. Tartar sauce. Barbecue sauce. Teriyaki sauce. Soy sauce, including reduced-sodium. Steak sauce. Canned and packaged gravies. Fish sauce. Oyster sauce. Cocktail sauce. Horseradish that you find on the shelf. Ketchup. Mustard. Meat flavorings and tenderizers. Bouillon cubes. Hot sauce and Tabasco sauce. Premade or packaged marinades. Premade or packaged taco seasonings. Relishes. Regular salad dressings. Where to find more information:  National Heart, Lung, and [place]: https://wilson-eaton.com/  American Heart Association: www.heart.org Summary  The DASH eating plan is a healthy eating plan that has been shown to reduce high blood pressure (hypertension). It may also reduce your risk for type 2 diabetes, heart disease, and stroke.  With the DASH eating plan, you should limit salt (sodium) intake to 2,300 mg a day. If you have hypertension, you may need to reduce your sodium intake to 1,500 mg a day.  When on the DASH eating plan, aim to eat more fresh fruits and vegetables, whole grains, lean proteins, low-fat dairy, and heart-healthy fats.  Work with your health care provider or diet and nutrition specialist (dietitian) to adjust your eating plan to your individual calorie needs. This information is not intended to replace advice given to you by your health care provider. Make sure you discuss any questions you have with your health care provider. Document  Revised: 12/15/2016 Document Reviewed: 12/27/2015 Elsevier Patient Education  2020 [employer].

## 2019-10-03 NOTE — Progress Notes (Signed)
   Subjective:    Patient ID: David Marquez, male    DOB: [birth year], 24 y.o.   MRN: [id number]  Patient presents for Elevated BP (high BP noted at dentist- 170/90)  He was at the dentist 178/93 at the dentist. 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 Willing to try again  Review Of Systems:  GEN- denies fatigue, fever, weight loss,weakness, recent illness HEENT- denies eye drainage, change in vision, nasal discharge, CVS- denies chest pain, palpitations RESP- denies SOB, cough, wheeze ABD- denies N/V, change in stools, abd pain GU- denies dysuria, hematuria, dribbling, incontinence MSK- denies joint pain, muscle aches, injury Neuro- denies headache, dizziness, syncope, seizure activity       Objective:    BP (!) 146/82 (BP Location: Right Arm, Patient Position: Sitting, Cuff Size: Large)   Pulse 82   Temp 98.1 F (36.7 C) (Temporal)   Resp 14   Ht 5\' 9"  (1.753 m)   Wt 238 lb (108 kg)   SpO2 97%   BMI 35.15 kg/m  GEN- NAD, alert and oriented x3 HEENT- PERRL, EOMI, non injected sclera, pink conjunctiva, MMM, oropharynx clear Neck- Supple, no thyromegaly CVS- RRR, no murmur RESP-CTAB ABD-NABS,soft,NT,ND EXT- No edema Pulses- Radial, DP- 2+  EKG- NSR, early repolarization, no ST changes       Assessment & Plan:      Problem List Items Addressed This Visit      Unprioritized   Anxiety   Relevant Orders   Ambulatory referral to Psychology   Obesity (BMI  35.0-39.9 without comorbidity)    Discussed dietary changes Check A1C Reduce carbs, salt in diet Increase water Work on lifestyle changes, EKG no red flags F/U in 1 month for BP recheck before starting meds  Controlling anxiety will also help with bp, he wants to do therapy, will send another referral       Relevant Orders   Hemoglobin A1c (Completed)    Other Visit Diagnoses    Elevated blood pressure reading    -  Primary   Relevant Orders   EKG 12-Lead (Completed)   Comprehensive metabolic panel (Completed)   CBC with Differential/Platelet (Completed)   Hemoglobin A1c (Completed)      Note: This dictation was prepared with Dragon dictation along with smaller phrase technology. Any transcriptional errors that result from this process are unintentional.

## 2019-10-04 LAB — CBC WITH DIFFERENTIAL/PLATELET
Absolute Monocytes: 456 cells/uL (ref 200–950)
Basophils Absolute: 20 cells/uL (ref 0–200)
Basophils Relative: 0.4 %
Eosinophils Absolute: 118 cells/uL (ref 15–500)
Eosinophils Relative: 2.4 %
HCT: 46.5 % (ref 38.5–50.0)
Hemoglobin: 15.5 g/dL (ref 13.2–17.1)
Lymphs Abs: 2430 cells/uL (ref 850–3900)
MCH: 28.8 pg (ref 27.0–33.0)
MCHC: 33.3 g/dL (ref 32.0–36.0)
MCV: 86.3 fL (ref 80.0–100.0)
MPV: 11.3 fL (ref 7.5–12.5)
Monocytes Relative: 9.3 %
Neutro Abs: 1877 cells/uL (ref 1500–7800)
Neutrophils Relative %: 38.3 %
Platelets: 247 10*3/uL (ref 140–400)
RBC: 5.39 10*6/uL (ref 4.20–5.80)
RDW: 12.5 % (ref 11.0–15.0)
Total Lymphocyte: 49.6 %
WBC: 4.9 10*3/uL (ref 3.8–10.8)

## 2019-10-04 LAB — COMPREHENSIVE METABOLIC PANEL
AG Ratio: 1.3 (calc) (ref 1.0–2.5)
ALT: 26 U/L (ref 9–46)
AST: 23 U/L (ref 10–40)
Albumin: 4.5 g/dL (ref 3.6–5.1)
Alkaline phosphatase (APISO): 81 U/L (ref 36–130)
BUN: 12 mg/dL (ref 7–25)
CO2: 24 mmol/L (ref 20–32)
Calcium: 9.8 mg/dL (ref 8.6–10.3)
Chloride: 102 mmol/L (ref 98–110)
Creat: 1.14 mg/dL (ref 0.60–1.35)
Globulin: 3.5 g/dL (calc) (ref 1.9–3.7)
Glucose, Bld: 85 mg/dL (ref 65–99)
Potassium: 4.9 mmol/L (ref 3.5–5.3)
Sodium: 138 mmol/L (ref 135–146)
Total Bilirubin: 0.4 mg/dL (ref 0.2–1.2)
Total Protein: 8 g/dL (ref 6.1–8.1)

## 2019-10-04 LAB — HEMOGLOBIN A1C
Hgb A1c MFr Bld: 5.4 % of total Hgb (ref <5.7)
Mean Plasma Glucose: 108 (calc)
eAG (mmol/L): 6 (calc)

## 2019-10-05 NOTE — Assessment & Plan Note (Signed)
Discussed dietary changes Check A1C Reduce carbs, salt in diet Increase water Work on lifestyle changes, EKG no red flags F/U in 1 month for BP recheck before starting meds  Controlling anxiety will also help with bp, he wants to do therapy, will send another referral

## 2019-10-23 DIAGNOSIS — Z20822 Contact with and (suspected) exposure to covid-19: Secondary | ICD-10-CM | POA: Diagnosis not present

## 2019-10-23 DIAGNOSIS — U071 COVID-19: Secondary | ICD-10-CM | POA: Diagnosis not present

## 2019-11-12 ENCOUNTER — Ambulatory Visit: Payer: BC Managed Care – PPO | Admitting: Family Medicine

## 2019-12-24 ENCOUNTER — Telehealth: Payer: Self-pay | Admitting: Family Medicine

## 2019-12-24 NOTE — Telephone Encounter (Signed)
Pt need a copy of his covid test for work

## 2019-12-24 NOTE — Telephone Encounter (Signed)
Call placed to patient to inquire.   Reports that work is requiring COVID vaccination for all members of unit.   States that he was diagnosed with COVID on 10/23/2019. States that he was advised to delay vaccination x3 months.  States that he requires note from PCP to delay vaccination.   MD please advise.

## 2019-12-24 NOTE — Telephone Encounter (Signed)
Call placed to patient and patient made aware.  

## 2019-12-24 NOTE — Telephone Encounter (Signed)
If he had monoclonal antibody infusion , he can have a letter stating 3 months from his positive test /treatment  Otherwise, he CAN get vaccinated

## 2020-04-15 ENCOUNTER — Ambulatory Visit: Payer: BC Managed Care – PPO | Admitting: Nurse Practitioner

## 2020-10-26 DIAGNOSIS — G4700 Insomnia, unspecified: Secondary | ICD-10-CM | POA: Diagnosis not present

## 2020-10-26 DIAGNOSIS — Z1159 Encounter for screening for other viral diseases: Secondary | ICD-10-CM | POA: Diagnosis not present

## 2020-10-26 DIAGNOSIS — Z1331 Encounter for screening for depression: Secondary | ICD-10-CM | POA: Diagnosis not present

## 2020-10-26 DIAGNOSIS — Z114 Encounter for screening for human immunodeficiency virus [HIV]: Secondary | ICD-10-CM | POA: Diagnosis not present

## 2020-10-26 DIAGNOSIS — Z131 Encounter for screening for diabetes mellitus: Secondary | ICD-10-CM | POA: Diagnosis not present

## 2020-10-26 DIAGNOSIS — F411 Generalized anxiety disorder: Secondary | ICD-10-CM | POA: Diagnosis not present

## 2020-10-26 DIAGNOSIS — Z113 Encounter for screening for infections with a predominantly sexual mode of transmission: Secondary | ICD-10-CM | POA: Diagnosis not present

## 2020-10-26 DIAGNOSIS — Z136 Encounter for screening for cardiovascular disorders: Secondary | ICD-10-CM | POA: Diagnosis not present

## 2020-10-26 DIAGNOSIS — R5383 Other fatigue: Secondary | ICD-10-CM | POA: Diagnosis not present

## 2021-02-08 DIAGNOSIS — E559 Vitamin D deficiency, unspecified: Secondary | ICD-10-CM | POA: Diagnosis not present

## 2021-02-08 DIAGNOSIS — Z1331 Encounter for screening for depression: Secondary | ICD-10-CM | POA: Diagnosis not present

## 2021-02-08 DIAGNOSIS — F411 Generalized anxiety disorder: Secondary | ICD-10-CM | POA: Diagnosis not present

## 2021-02-08 DIAGNOSIS — E785 Hyperlipidemia, unspecified: Secondary | ICD-10-CM | POA: Diagnosis not present

## 2021-02-08 DIAGNOSIS — K5900 Constipation, unspecified: Secondary | ICD-10-CM | POA: Diagnosis not present

## 2021-02-23 DIAGNOSIS — R590 Localized enlarged lymph nodes: Secondary | ICD-10-CM | POA: Diagnosis not present

## 2021-02-23 DIAGNOSIS — Z202 Contact with and (suspected) exposure to infections with a predominantly sexual mode of transmission: Secondary | ICD-10-CM | POA: Diagnosis not present

## 2021-10-27 DIAGNOSIS — R1030 Lower abdominal pain, unspecified: Secondary | ICD-10-CM | POA: Diagnosis not present

## 2021-10-27 DIAGNOSIS — R300 Dysuria: Secondary | ICD-10-CM | POA: Diagnosis not present

## 2022-01-03 DIAGNOSIS — J039 Acute tonsillitis, unspecified: Secondary | ICD-10-CM | POA: Diagnosis not present

## 2022-01-03 DIAGNOSIS — Z1152 Encounter for screening for COVID-19: Secondary | ICD-10-CM | POA: Diagnosis not present

## 2022-07-13 DIAGNOSIS — E6609 Other obesity due to excess calories: Secondary | ICD-10-CM | POA: Diagnosis not present

## 2022-07-13 DIAGNOSIS — M62838 Other muscle spasm: Secondary | ICD-10-CM | POA: Diagnosis not present

## 2022-07-13 DIAGNOSIS — Z202 Contact with and (suspected) exposure to infections with a predominantly sexual mode of transmission: Secondary | ICD-10-CM | POA: Diagnosis not present

## 2022-07-13 DIAGNOSIS — Z0000 Encounter for general adult medical examination without abnormal findings: Secondary | ICD-10-CM | POA: Diagnosis not present

## 2022-07-13 DIAGNOSIS — Z131 Encounter for screening for diabetes mellitus: Secondary | ICD-10-CM | POA: Diagnosis not present
